# Patient Record
Sex: Female | Born: 1990 | Race: Black or African American | Hispanic: No | Marital: Single | Smoking: Never smoker
Health system: Southern US, Community
[De-identification: ages and names within clinical notes are randomized; demographics above are authoritative.]

## PROBLEM LIST (undated history)

## (undated) DIAGNOSIS — R51 Headache: Secondary | ICD-10-CM

---
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
## 2008-06-14 ENCOUNTER — Emergency Department (HOSPITAL_COMMUNITY): Admission: EM | Admit: 2008-06-14 | Discharge: 2008-06-14 | Payer: Self-pay | Admitting: Emergency Medicine

## 2008-07-14 ENCOUNTER — Emergency Department (HOSPITAL_COMMUNITY): Admission: EM | Admit: 2008-07-14 | Discharge: 2008-07-14 | Payer: Self-pay | Admitting: Family Medicine

## 2010-06-01 ENCOUNTER — Emergency Department (HOSPITAL_COMMUNITY): Admission: EM | Admit: 2010-06-01 | Discharge: 2010-06-01 | Payer: Self-pay | Admitting: Family Medicine

## 2010-10-03 ENCOUNTER — Emergency Department (HOSPITAL_COMMUNITY)
Admission: EM | Admit: 2010-10-03 | Discharge: 2010-10-03 | Disposition: A | Discharge status: Home / Self Care | Payer: BC Managed Care – PPO | Attending: Emergency Medicine | Admitting: Emergency Medicine

## 2010-10-03 ENCOUNTER — Emergency Department (HOSPITAL_COMMUNITY): Payer: BC Managed Care – PPO

## 2010-10-03 DIAGNOSIS — M549 Dorsalgia, unspecified: Secondary | ICD-10-CM | POA: Insufficient documentation

## 2010-10-03 DIAGNOSIS — M25519 Pain in unspecified shoulder: Secondary | ICD-10-CM | POA: Insufficient documentation

## 2010-10-03 DIAGNOSIS — M940 Chondrocostal junction syndrome [Tietze]: Secondary | ICD-10-CM | POA: Insufficient documentation

## 2010-10-03 DIAGNOSIS — R071 Chest pain on breathing: Secondary | ICD-10-CM | POA: Insufficient documentation

## 2010-10-03 DIAGNOSIS — R079 Chest pain, unspecified: Secondary | ICD-10-CM | POA: Insufficient documentation

## 2010-10-03 LAB — POCT CARDIAC MARKERS
CKMB, poc: <1 ng/mL — ABNORMAL LOW (ref 1.0–8.0)
CKMB, poc: <1 ng/mL — ABNORMAL LOW (ref 1.0–8.0)
Myoglobin, poc: 62.8 ng/mL (ref 12–200)
Myoglobin, poc: 59 ng/mL (ref 12–200)
Troponin i, poc: <0.05 ng/mL (ref 0.00–0.09)
Troponin i, poc: <0.05 ng/mL (ref 0.00–0.09)

## 2010-10-03 LAB — POCT I-STAT, CHEM 8
BUN: 14 mg/dL (ref 6–23)
Calcium, Ion: 1.14 mmol/L (ref 1.12–1.32)
Chloride: 106 meq/L (ref 96–112)
Creatinine, Ser: 1.2 mg/dL (ref 0.4–1.2)
Glucose, Bld: 88 mg/dL (ref 70–99)
HCT: 49 % — ABNORMAL HIGH (ref 36.0–46.0)
Hemoglobin: 16.7 g/dL — ABNORMAL HIGH (ref 12.0–15.0)
Potassium: 3.4 meq/L — ABNORMAL LOW (ref 3.5–5.1)
Sodium: 141 meq/L (ref 135–145)
TCO2: 23 mmol/L (ref 0–100)

## 2010-10-03 LAB — COMPREHENSIVE METABOLIC PANEL WITH GFR
ALT: 11 U/L (ref 0–35)
AST: 18 U/L (ref 0–37)
Albumin: 3.9 g/dL (ref 3.5–5.2)
Alkaline Phosphatase: 68 U/L (ref 39–117)
BUN: 13 mg/dL (ref 6–23)
CO2: 23 meq/L (ref 19–32)
Calcium: 9.8 mg/dL (ref 8.4–10.5)
Chloride: 106 meq/L (ref 96–112)
Creatinine, Ser: 1.07 mg/dL (ref 0.4–1.2)
GFR calc non Af Amer: >60 mL/min
Glucose, Bld: 93 mg/dL (ref 70–99)
Potassium: 3.4 meq/L — ABNORMAL LOW (ref 3.5–5.1)
Sodium: 137 meq/L (ref 135–145)
Total Bilirubin: 0.3 mg/dL (ref 0.3–1.2)
Total Protein: 8.7 g/dL — ABNORMAL HIGH (ref 6.0–8.3)

## 2010-10-03 LAB — DIFFERENTIAL
Basophils Relative: 0 % (ref 0–1)
Eosinophils Absolute: 0.1 10*3/uL (ref 0.0–0.7)
Lymphs Abs: 2.6 10*3/uL (ref 0.7–4.0)
Monocytes Absolute: 0.7 10*3/uL (ref 0.1–1.0)
Monocytes Relative: 7 % (ref 3–12)

## 2010-10-03 LAB — CBC
MCH: 29.6 pg (ref 26.0–34.0)
MCHC: 34.5 g/dL (ref 30.0–36.0)
MCV: 85.7 fL (ref 78.0–100.0)
Platelets: 224 10*3/uL (ref 150–400)

## 2010-10-03 LAB — POCT PREGNANCY, URINE: Preg Test, Ur: NEGATIVE

## 2010-10-03 LAB — LIPASE, BLOOD: Lipase: 29 U/L (ref 11–59)

## 2010-10-03 LAB — D-DIMER, QUANTITATIVE

## 2010-10-26 LAB — POCT URINALYSIS DIPSTICK
Glucose, UA: NEGATIVE mg/dL
Hgb urine dipstick: NEGATIVE
Nitrite: NEGATIVE
Protein, ur: 100 mg/dL — AB
Specific Gravity, Urine: 1.02 (ref 1.005–1.030)
Urobilinogen, UA: 1 mg/dL (ref 0.0–1.0)
pH: 6 (ref 5.0–8.0)

## 2011-05-09 LAB — URINALYSIS, ROUTINE W REFLEX MICROSCOPIC
Bilirubin Urine: NEGATIVE
Hgb urine dipstick: NEGATIVE
Nitrite: NEGATIVE
Specific Gravity, Urine: 1.022
Urobilinogen, UA: 1
pH: 5.5

## 2011-05-09 LAB — URINE MICROSCOPIC-ADD ON

## 2011-05-09 LAB — CBC
HCT: 41.8
MCHC: 33.2
MCV: 84
Platelets: 301
RDW: 14.7
WBC: 7.4

## 2011-05-09 LAB — DIFFERENTIAL
Basophils Absolute: 0.1
Eosinophils Relative: 1
Lymphocytes Relative: 32
Lymphs Abs: 2.4
Monocytes Absolute: 0.6
Neutro Abs: 4.3

## 2011-05-09 LAB — COMPREHENSIVE METABOLIC PANEL
AST: 33
Albumin: 3.7
BUN: 9
Calcium: 9.6
Creatinine, Ser: 1.07
GFR calc Af Amer: >60
Total Bilirubin: 0.7
Total Protein: 8.1

## 2011-05-09 LAB — WET PREP, GENITAL: Trich, Wet Prep: NONE SEEN

## 2011-05-19 LAB — POCT INFECTIOUS MONO SCREEN: Mono Screen: NEGATIVE

## 2011-05-19 LAB — POCT RAPID STREP A: Streptococcus, Group A Screen (Direct): NEGATIVE

## 2011-10-09 ENCOUNTER — Encounter (HOSPITAL_COMMUNITY): Payer: Self-pay | Admitting: *Deleted

## 2011-10-09 ENCOUNTER — Emergency Department (HOSPITAL_COMMUNITY)
Admission: EM | Admit: 2011-10-09 | Discharge: 2011-10-10 | Discharge status: Against Medical Advice | Payer: BC Managed Care – PPO | Attending: Emergency Medicine | Admitting: Emergency Medicine

## 2011-10-09 DIAGNOSIS — R51 Headache: Secondary | ICD-10-CM

## 2011-10-09 HISTORY — DX: Headache: R51

## 2011-10-09 NOTE — ED Notes (Signed)
Headache since yesterday no nv.  Hx of the same

## 2011-10-10 LAB — URINALYSIS, ROUTINE W REFLEX MICROSCOPIC
Glucose, UA: NEGATIVE mg/dL
Ketones, ur: NEGATIVE mg/dL
Leukocytes, UA: NEGATIVE
Nitrite: NEGATIVE
Protein, ur: 30 mg/dL — AB
Urobilinogen, UA: 1 mg/dL (ref 0.0–1.0)

## 2011-10-10 LAB — POCT PREGNANCY, URINE: Preg Test, Ur: NEGATIVE

## 2011-10-10 LAB — URINE MICROSCOPIC-ADD ON

## 2011-10-10 MED ORDER — KETOROLAC TROMETHAMINE 30 MG/ML IJ SOLN
30.0000 mg | Freq: Once | INTRAMUSCULAR | Status: AC
  Administered 2011-10-10: 30 mg via INTRAMUSCULAR
  Filled 2011-10-10: qty 1

## 2011-10-10 MED ORDER — DIPHENHYDRAMINE HCL 25 MG PO CAPS: 25.0000 mg | ORAL_CAPSULE | Freq: Once | ORAL | Status: DC

## 2011-10-10 MED ORDER — DEXAMETHASONE 10 MG/ML FOR PEDIATRIC ORAL USE: 10.0000 mg | Freq: Once | INTRAMUSCULAR | Status: DC

## 2011-10-10 MED ORDER — TRAMADOL HCL 50 MG PO TABS
50.0000 mg | ORAL_TABLET | Freq: Once | ORAL | Status: AC
  Administered 2011-10-10: 50 mg via ORAL
  Filled 2011-10-10: qty 1

## 2011-10-10 NOTE — ED Provider Notes (Signed)
Medical screening examination/treatment/procedure(s) were performed by non-physician practitioner and as supervising physician I was immediately available for consultation/collaboration.   Logen Heintzelman A. Derk Doubek, MD 10/10/11 2256 

## 2011-10-10 NOTE — ED Provider Notes (Signed)
Medical screening examination/treatment/procedure(s) were performed by non-physician practitioner and as supervising physician I was immediately available for consultation/collaboration.   Laelynn Blizzard A. Patrica Duel, MD 10/10/11 1610

## 2011-10-10 NOTE — ED Provider Notes (Addendum)
History     CSN: [id number]  Arrival date & time 10/09/11  2125   First MD Initiated Contact with Patient 10/10/11 0025      Chief Complaint  Patient presents with  . Headache    (Consider location/radiation/quality/duration/timing/severity/associated sxs/prior treatment) HPI Comments: Patient with chronic headaches 2-3 per week, usually treated with Excedrin Migraine.  She's had this headache for 2 days persistently without relief from multiple doses of Excedrin migraine.  Denies nausea, vomiting, photophobia, phonophobia, sinus congestion, URI, symptoms  Patient is a 21 y.o. female presenting with headaches. The history is provided by the patient.  Headache  This is a chronic problem. The current episode started yesterday. The problem occurs constantly. The problem has not changed since onset.The headache is associated with nothing. The pain is located in the left unilateral region. The quality of the pain is described as dull and throbbing. The pain is at a severity of 4/10. The pain is moderate. The pain does not radiate. Pertinent negatives include no fever and no vomiting.    Past Medical History  Diagnosis Date  . Headache     History reviewed. No pertinent past surgical history.  History reviewed. No pertinent family history.  History  Substance Use Topics  . Smoking status: Never Smoker   . Smokeless tobacco: Not on file  . Alcohol Use: No    OB History    Grav Para Term Preterm Abortions TAB SAB Ect Mult Living                  Review of Systems  Constitutional: Negative for fever and chills.  HENT: Negative for rhinorrhea.   Gastrointestinal: Negative for vomiting.  Neurological: Positive for headaches. Negative for dizziness and weakness.    Allergies  Review of patient's allergies indicates no known allergies.  Home Medications   Current Outpatient Rx  Name Route Sig Dispense Refill  . ASPIRIN-ACETAMINOPHEN-CAFFEINE 250-250-65 MG PO TABS Oral  Take 2 tablets by mouth every 6 (six) hours as needed. For head ache    . NORGESTREL-ETHINYL ESTRADIOL 0.3-30 MG-MCG PO TABS Oral Take 1 tablet by mouth daily.      BP 104/62  Pulse 76  Temp(Src) 98.6 F (37 C) (Oral)  Resp 16  SpO2 100%  LMP 10/09/2011  Physical Exam  Constitutional: She appears well-developed.  Eyes: Pupils are equal, round, and reactive to light.  Neck: Normal range of motion.  Pulmonary/Chest: Effort normal.  Musculoskeletal: Normal range of motion.  Neurological: She is alert.  Skin: Skin is warm and dry.    ED Course  Procedures (including critical care time)  Labs Reviewed  URINALYSIS, ROUTINE W REFLEX MICROSCOPIC - Abnormal; Notable for the following:    Specific Gravity, Urine 1.036 (*)    Protein, ur 30 (*)    All other components within normal limits  POCT PREGNANCY, URINE  URINE MICROSCOPIC-ADD ON  LAB REPORT - SCANNED   No results found.   1. Headache       MDM  Chronic headaches.  Will refer patient to the [facility]        Arman Filter, NP 10/10/11 0148  Arman Filter, NP 10/10/11 2003  Arman Filter, NP 10/10/11 2005

## 2011-10-10 NOTE — ED Notes (Signed)
Pt presents to department for evaluation of headache. Pt states history of migraines, no relief with medications at home. Pt also states dizziness and photosensitivity. No nausea/vomiting. 8/10 pain at the time. Ambulatory to exam room. She is conscious alert and oriented x4. Pupils reactive to light. No neurological deficits noted. No signs of acute distress at present.

## 2011-10-10 NOTE — ED Notes (Signed)
Pt states headache continues, no relief from pain. Rating 7/10 at the time. Will continue to monitor. Provider notified.

## 2011-10-10 NOTE — ED Notes (Addendum)
Pt wishing to leave AMA, informed of risks. Provider notified. Pt instructed to return with worsening of symptoms.

## 2011-11-14 ENCOUNTER — Encounter (HOSPITAL_COMMUNITY): Payer: Self-pay | Admitting: Emergency Medicine

## 2011-11-14 ENCOUNTER — Emergency Department (INDEPENDENT_AMBULATORY_CARE_PROVIDER_SITE_OTHER)
Admission: EM | Admit: 2011-11-14 | Discharge: 2011-11-14 | Disposition: A | Discharge status: Home / Self Care | Payer: BC Managed Care – PPO | Source: Home / Self Care | Attending: Family Medicine | Admitting: Family Medicine

## 2011-11-14 DIAGNOSIS — B373 Candidiasis of vulva and vagina: Secondary | ICD-10-CM

## 2011-11-14 LAB — POCT URINALYSIS DIP (DEVICE)
Glucose, UA: NEGATIVE mg/dL
Hgb urine dipstick: NEGATIVE
Ketones, ur: 15 mg/dL — AB
Nitrite: NEGATIVE
Specific Gravity, Urine: 1.02 (ref 1.005–1.030)
pH: 7.5 (ref 5.0–8.0)

## 2011-11-14 LAB — WET PREP, GENITAL
Trich, Wet Prep: NONE SEEN
Yeast Wet Prep HPF POC: NONE SEEN

## 2011-11-14 MED ORDER — FLUCONAZOLE 150 MG PO TABS: 150.0000 mg | ORAL_TABLET | Freq: Once | ORAL | Status: AC

## 2011-11-14 NOTE — ED Provider Notes (Signed)
History     CSN: [id number]  Arrival date & time 11/14/11  1123   First MD Initiated Contact with Patient 11/14/11 1236      Chief Complaint  Patient presents with  . Vaginal Discharge    (Consider location/radiation/quality/duration/timing/severity/associated sxs/prior treatment) HPI Comments: Poet presents for evaluation of vaginal discharge since yesterday. She reports some "burning" and irritation, but no itching, no urinary symptoms.   Patient is a 21 y.o. female presenting with vaginal discharge. The history is provided by the patient.  Vaginal Discharge This is a new problem. The problem occurs constantly. The problem has not changed since onset.The symptoms are aggravated by nothing. The symptoms are relieved by nothing. Treatments tried: Monistat.    Past Medical History  Diagnosis Date  . Headache     History reviewed. No pertinent past surgical history.  No family history on file.  History  Substance Use Topics  . Smoking status: Never Smoker   . Smokeless tobacco: Not on file  . Alcohol Use: No    OB History    Grav Para Term Preterm Abortions TAB SAB Ect Mult Living                  Review of Systems  Constitutional: Negative.   HENT: Negative.   Eyes: Negative.   Respiratory: Negative.   Cardiovascular: Negative.   Gastrointestinal: Negative.   Genitourinary: Positive for vaginal discharge. Negative for dysuria, urgency, frequency, hematuria, decreased urine volume, vaginal bleeding, vaginal pain, menstrual problem and pelvic pain.  Musculoskeletal: Negative.   Skin: Negative.   Neurological: Negative.     Allergies  Review of patient's allergies indicates no known allergies.  Home Medications   Current Outpatient Rx  Name Route Sig Dispense Refill  . MONISTAT 1 VA Vaginal Place vaginally. Monistat one day treatment used yesterday    . ASPIRIN-ACETAMINOPHEN-CAFFEINE 250-250-65 MG PO TABS Oral Take 2 tablets by mouth every 6 (six) hours  as needed. For head ache    . FLUCONAZOLE 150 MG PO TABS Oral Take 1 tablet (150 mg total) by mouth once. Take one pill once. May repeat if symptoms persist after 3rd day. 2 tablet 0  . NORGESTREL-ETHINYL ESTRADIOL 0.3-30 MG-MCG PO TABS Oral Take 1 tablet by mouth daily.      BP 133/89  Pulse 90  Temp(Src) 98.4 F (36.9 C) (Oral)  Resp 16  SpO2 100%  LMP 11/04/2011  Physical Exam  Nursing note and vitals reviewed. Constitutional: She is oriented to person, place, and time. She appears well-developed and well-nourished.  HENT:  Head: Normocephalic and atraumatic.  Eyes: EOM are normal.  Neck: Normal range of motion.  Pulmonary/Chest: Effort normal.  Genitourinary: Pelvic exam was performed with patient supine. There is no rash on the right labia. There is no rash on the left labia. Cervix exhibits no discharge. Vaginal discharge found.  Musculoskeletal: Normal range of motion.  Neurological: She is alert and oriented to person, place, and time.  Skin: Skin is warm and dry.  Psychiatric: Her behavior is normal.    ED Course  Procedures (including critical care time)  Labs Reviewed  POCT URINALYSIS DIP (DEVICE) - Abnormal; Notable for the following:    Bilirubin Urine SMALL (*)    Ketones, ur 15 (*)    Protein, ur 30 (*)    Leukocytes, UA TRACE (*) Biochemical Testing Only. Please order routine urinalysis from main lab if confirmatory testing is needed.   All other components within normal limits  POCT PREGNANCY, URINE   No results found.   1. Candidal vaginitis       MDM  Labs reviewed; rx given for fluconazole PO        Renaee Munda, MD 11/14/11 1513

## 2011-11-14 NOTE — Discharge Instructions (Signed)
Take medication as directed. Return to care should your symptoms not improve, or worsen in any way.

## 2011-11-14 NOTE — ED Notes (Signed)
Equipment at bedside, patient undressed

## 2011-11-14 NOTE — ED Notes (Signed)
On Friday, noticed vaginal burning.  Patient was using tampons on Friday due to menses, then period ended (stopped using tampons ) and burning continued.  Yesterday noticed vaginal discharge.  Denies urinary symptoms.

## 2011-11-16 ENCOUNTER — Telehealth (HOSPITAL_COMMUNITY): Payer: Self-pay | Admitting: *Deleted

## 2011-11-16 LAB — GC/CHLAMYDIA PROBE AMP, GENITAL
Chlamydia, DNA Probe: NEGATIVE
GC Probe Amp, Genital: NEGATIVE

## 2011-11-16 MED ORDER — METRONIDAZOLE 500 MG PO TABS: 500.0000 mg | ORAL_TABLET | Freq: Two times a day (BID) | ORAL | Status: AC

## 2011-11-16 NOTE — ED Notes (Signed)
GC/Chlamydia neg., Wet prep: mod. clue cells, few WBC's.  Pt. treated with Diflucan.  Labs shown to Dr. Lorenza Chick and he e-prescribed Flagyl to the [facility] on [address].  I called pt. Pt. verified x 2 and given results. Pt. told that the doctor sent her Rx. to her preferred pharmacy the [facility] on [address] and to call before picking it up to make sure it was ready.  Pt. instructed to no alcohol while taking this medication. Pt.'s questions answered.  Alicia Chan 11/16/2011

## 2012-05-10 IMAGING — CR DG CHEST 2V
2 series · 2 of 2 positions shown · non-contrast
Comparison: None.

CLINICAL DATA: Chest pain.

CHEST - 2 VIEW

[w chest pa]
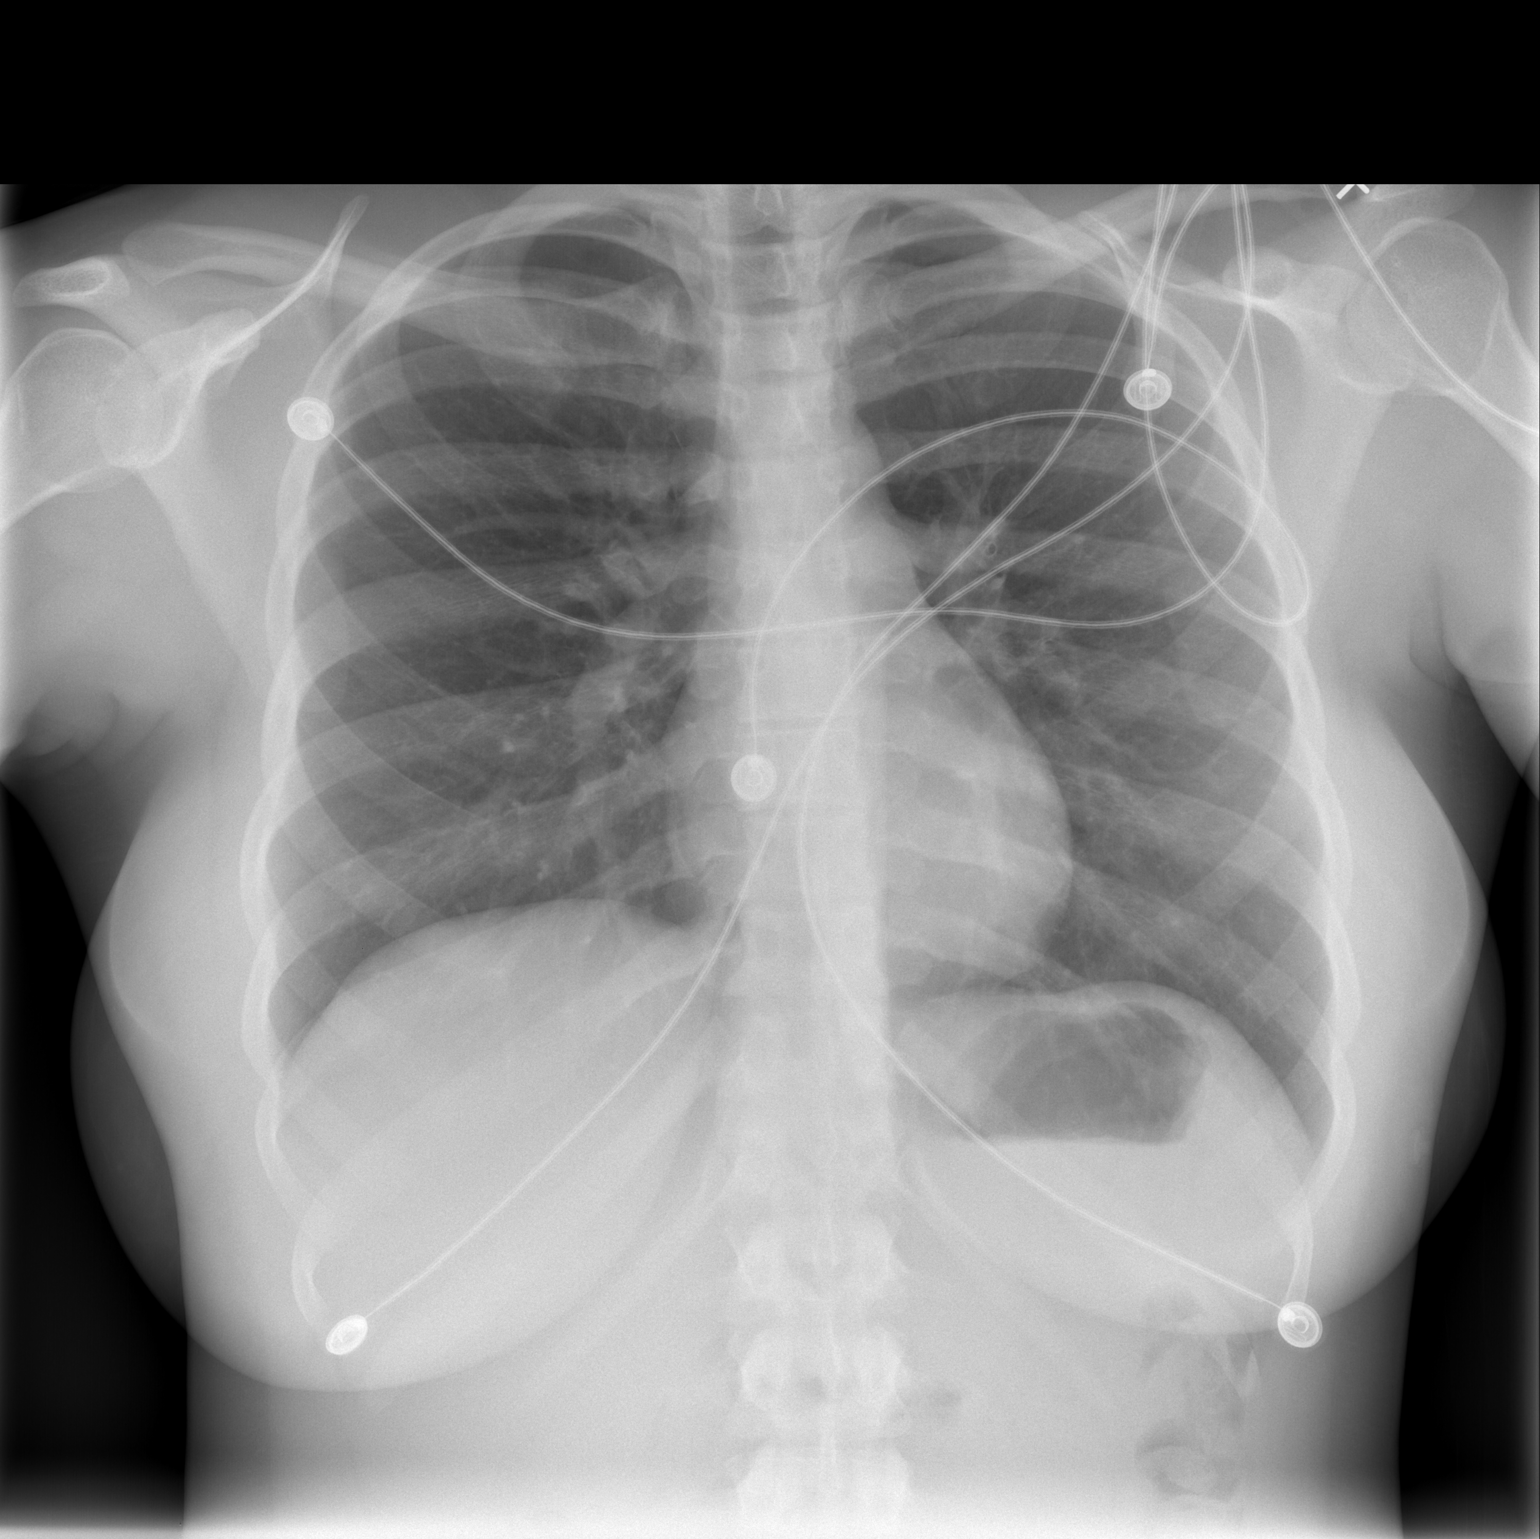

[w chest lat]
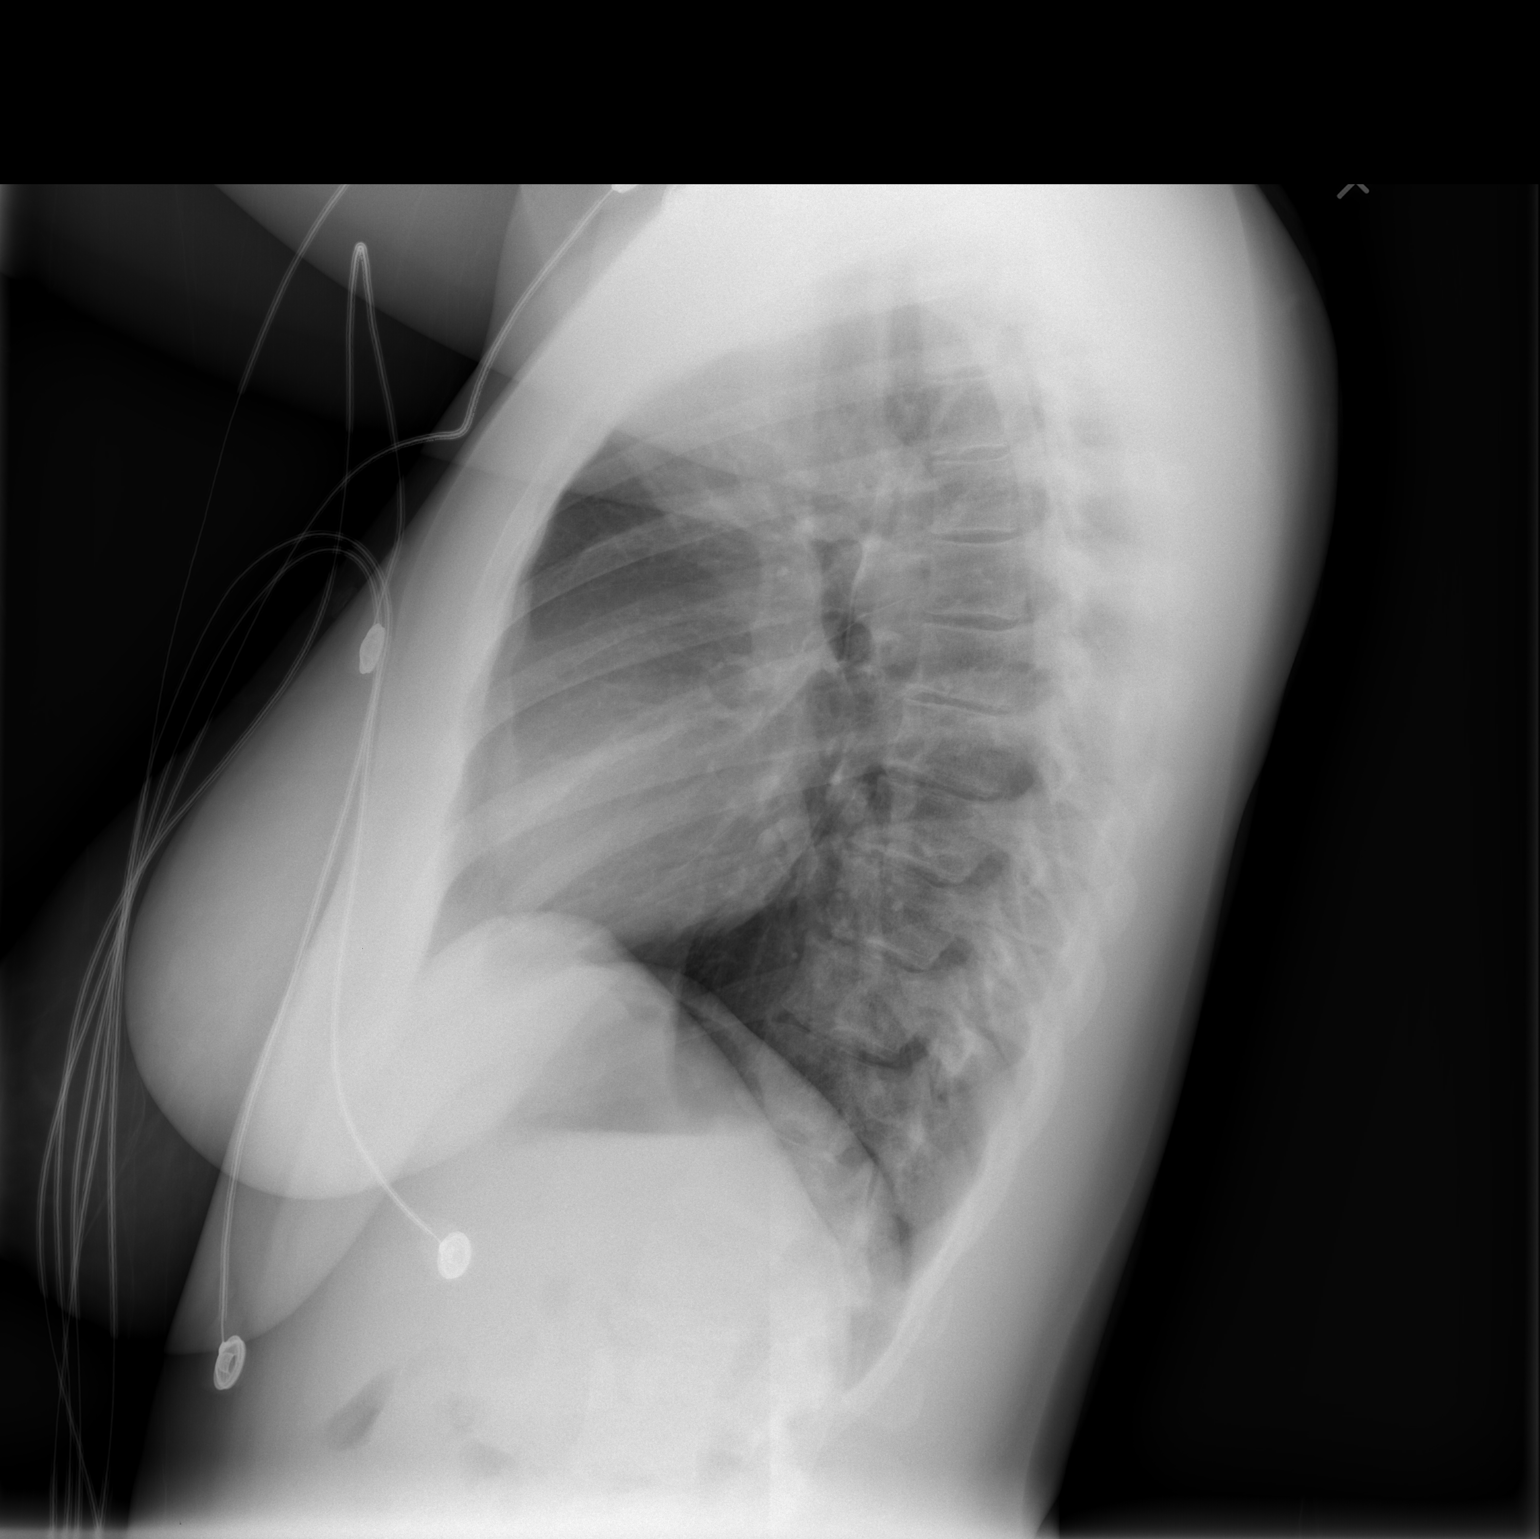

[2 of 2 positions shown; findings below may reference images not displayed]

FINDINGS: The heart size is normal.  The lungs are clear.  The
visualized soft tissues and bony thorax are unremarkable.
IMPRESSION: Negative chest.

## 2016-02-02 ENCOUNTER — Telehealth: Payer: Self-pay | Admitting: Physician Assistant

## 2016-02-02 ENCOUNTER — Emergency Department
Admission: EM | Admit: 2016-02-02 | Discharge: 2016-02-02 | Disposition: A | Discharge status: Home / Self Care | Payer: Worker's Compensation | Attending: Emergency Medicine | Admitting: Emergency Medicine

## 2016-02-02 DIAGNOSIS — Z7982 Long term (current) use of aspirin: Secondary | ICD-10-CM | POA: Diagnosis not present

## 2016-02-02 DIAGNOSIS — Z77098 Contact with and (suspected) exposure to other hazardous, chiefly nonmedicinal, chemicals: Secondary | ICD-10-CM | POA: Diagnosis not present

## 2016-02-02 LAB — ALT: ALT: 17 U/L (ref 14–54)

## 2016-02-02 LAB — CBC WITH DIFFERENTIAL/PLATELET
Basophils Absolute: 0 10*3/uL (ref 0–0.1)
Basophils Relative: 1 %
EOS ABS: 0.1 10*3/uL (ref 0–0.7)
EOS PCT: 1 %
HCT: 40.4 % (ref 35.0–47.0)
Hemoglobin: 13.2 g/dL (ref 12.0–16.0)
LYMPHS ABS: 2.8 10*3/uL (ref 1.0–3.6)
LYMPHS PCT: 37 %
MCH: 28.7 pg (ref 26.0–34.0)
MCHC: 32.7 g/dL (ref 32.0–36.0)
MCV: 87.8 fL (ref 80.0–100.0)
MONO ABS: 0.5 10*3/uL (ref 0.2–0.9)
MONOS PCT: 7 %
Neutro Abs: 4.1 10*3/uL (ref 1.4–6.5)
Neutrophils Relative %: 54 %
PLATELETS: 245 10*3/uL (ref 150–440)
RBC: 4.6 MIL/uL (ref 3.80–5.20)
RDW: 13.5 % (ref 11.5–14.5)
WBC: 7.6 10*3/uL (ref 3.6–11.0)

## 2016-02-02 LAB — RAPID HIV SCREEN (HIV 1/2 AB+AG)
HIV 1/2 Antibodies: NONREACTIVE
HIV-1 P24 ANTIGEN - HIV24: REACTIVE — AB

## 2016-02-02 LAB — AST: AST: 19 U/L (ref 15–41)

## 2016-02-02 LAB — CREATININE, SERUM
CREATININE: 0.77 mg/dL (ref 0.44–1.00)
GFR calc Af Amer: >60 mL/min (ref >60)
GFR calc non Af Amer: >60 mL/min (ref >60)

## 2016-02-02 NOTE — ED Provider Notes (Signed)
[facility] Emergency Department Provider Note   ____________________________________________  Time seen: Approximately 8:16 PM  I have reviewed the triage vital signs and the nursing notes.   HISTORY  Chief Complaint Chemical Exposure    HPI Alicia Chan is a 25 y.o. 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 Patient was splashed in the face by a specimen tray containing a buccal swab and chemical  that amplifies DNA APPROXIMATELY 3 HOURS AGO. Patient stays copious eye irrigation was performed prior to arrival. Patient did not believe that the chemotherapy gotten into her eye or mouth. Patient is requesting chemical exposure protocol. There is no complaint of pain.  Past Medical History  Diagnosis Date  . Headache(784.0)     There are no active problems to display for this patient.   History reviewed. No pertinent past surgical history.  Current Outpatient Rx  Name  Route  Sig  Dispense  Refill  . aspirin-acetaminophen-caffeine (EXCEDRIN MIGRAINE) 250-250-65 MG per tablet   Oral   Take 2 tablets by mouth every 6 (six) hours as needed. For head ache         . norgestrel-ethinyl estradiol (LO/OVRAL,CRYSELLE) 0.3-30 MG-MCG tablet   Oral   Take 1 tablet by mouth daily.         . Tioconazole (MONISTAT 1 VA)   Vaginal   Place vaginally. Monistat one day treatment used yesterday           Allergies Review of patient's allergies indicates no known allergies.  No family history on file.  Social History Social History  Substance Use Topics  . Smoking status: Never Smoker   . Smokeless tobacco: None  . Alcohol Use: No    Review of Systems Constitutional: No fever/chills Eyes: No visual changes. ENT: No sore throat. Cardiovascular: Denies chest pain. Respiratory: Denies shortness of breath. Gastrointestinal: No abdominal pain.  No nausea, no vomiting.  No diarrhea.  No constipation. Genitourinary:  Negative for dysuria. Musculoskeletal: Negative for back pain. Skin: Negative for rash. Neurological: Negative for headaches, focal weakness or numbness. ____________________________________________   PHYSICAL EXAM:  VITAL SIGNS: ED Triage Vitals  Enc Vitals Group     BP 02/02/16 1919 118/85 mmHg     Pulse Rate 02/02/16 1919 79     Resp 02/02/16 1919 18     Temp 02/02/16 1919 97.8 F (36.6 C)     Temp Source 02/02/16 1919 Oral     SpO2 02/02/16 1919 100 %     Weight 02/02/16 1919 196 lb (88.905 kg)     Height 02/02/16 1919  (1.702 m)     Head Cir --      Peak Flow --      Pain Score 02/02/16 1920 3     Pain Loc --      Pain Edu? --      Excl. in GC? --     Constitutional: Alert and oriented. Well appearing and in no acute distress. Eyes: Conjunctivae are normal. PERRL. EOMI. Head: Atraumatic. Nose: No congestion/rhinnorhea. Mouth/Throat: Mucous membranes are moist.  Oropharynx non-erythematous. Neck: No stridor.  No cervical spine tenderness to palpation. Hematological/Lymphatic/Immunilogical: No cervical lymphadenopathy. Cardiovascular: Normal rate, regular rhythm. Grossly normal heart sounds.  Good peripheral circulation. Respiratory: Normal respiratory effort.  No retractions. Lungs CTAB. Gastrointestinal: Soft and nontender. No distention. No abdominal bruits. No CVA tenderness. Musculoskeletal: No lower extremity tenderness nor edema.  No joint effusions. Neurologic:  Normal speech and language. No  gross focal neurologic deficits are appreciated. No gait instability. Skin:  Skin is warm, dry and intact. No rash noted. Psychiatric: Mood and affect are normal. Speech and behavior are normal.  ____________________________________________   LABS (all labs ordered are listed, but only abnormal results are displayed)  Labs Reviewed  HEPATITIS PANEL, ACUTE  RAPID HIV SCREEN (HIV 1/2 AB+AG)    ____________________________________________  EKG   ____________________________________________  RADIOLOGY   ____________________________________________   PROCEDURES  Procedure(s) performed: None  Critical Care performed: No  ____________________________________________   INITIAL IMPRESSION / ASSESSMENT AND PLAN / ED COURSE  Pertinent labs & imaging results that were available during my care of the patient were reviewed by me and considered in my medical decision making (see chart for details).  Chemical exposure. Patient given discharge care instructions. Patient had baseline acute hep panel and HIV drawn today. Per protocol patient had repeat labs in 6 weeks and 6 months. Patient reports understanding of pending labs.  ____________________________________________   FINAL CLINICAL IMPRESSION(S) / ED DIAGNOSES  Final diagnoses:  Exposure to chemical compounds      NEW MEDICATIONS STARTED DURING THIS VISIT:  New Prescriptions   No medications on file     Note:  This document was prepared using Dragon voice recognition software and may include unintentional dictation errors.    Joni Reiningonald K Zykia Walla, PA-C 02/02/16 2032  Phineas SemenGraydon Goodman, MD 02/02/16 [phone]

## 2016-02-02 NOTE — ED Notes (Signed)
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 Information passed along to original provider Katrinka Blazing(Smith, PA-C) who asked that this RN call patient and advise her to follow up with ID. Ula LingoSmith, PA-C provided [phone] for this RN to give to patient and have her call them.

## 2016-02-02 NOTE — ED Notes (Addendum)
Working at [employer] and a specimen in a tray splashed her in the face. Buccal Swab and chemical that amplifies the DNA splashed on her face. She felt something on top lip and left cheek.  They did eyewash at work and sent patient here. States her eyes are burning a little. No redness noted to either eye and no drainage noted.

## 2016-02-02 NOTE — Discharge Instructions (Signed)
Pertinent exposure protocol advised repeat acute hepatitis panel and HIV in 6 weeks.

## 2016-02-02 NOTE — ED Notes (Signed)
23:15 - Patient returned call at this time. RN explained to her that the results of her recent HIV was POSITIVE and that she needed to follow up with ID ASAP for further care and treatment. Patient provided with the number that the PA gave [phone]) and told to call them first thing in the morning.

## 2016-02-02 NOTE — ED Notes (Signed)
Working at [employer] and a specimen in a tray splashed her in the face. They did eyewash at work and sent patient here. States her eyes are burning a little. No redness noted to either eye and no drainage noted.

## 2016-02-02 NOTE — ED Notes (Signed)
Worker's comp. completed and delivered to lab for courier p/u.

## 2016-02-03 NOTE — Progress Notes (Signed)
ID note  Spoke with patient - she reports negative test in May 19th during STD screen. She also had routine gyn visit same day and testing is pending.   I have asked her to come to [facility] to [facility] me today for further evaluation and testing.

## 2016-02-03 NOTE — ED Notes (Signed)
At 1225 today [name] from lab called to speak to charge RN and told that pt had a "Positive Hit" on HIV testing and 2 additional SST tubes were to be drawn. There were a total per lab of a lavender and 1 SST tube drawn. Pt was called at 1300 to notify that lab needed pt to come back to have additional labs drawn. Per lab pt was to come directly to lab. Pt informed of this and reports that she is on her way.

## 2016-02-04 ENCOUNTER — Telehealth: Payer: Self-pay | Admitting: Infectious Disease

## 2016-02-04 LAB — HEPATITIS PANEL, ACUTE
HCV Ab: <0.1 s/co ratio (ref 0.0–0.9)
Hep A IgM: NEGATIVE
Hep B C IgM: NEGATIVE
Hepatitis B Surface Ag: NEGATIVE

## 2016-02-04 NOTE — Telephone Encounter (Signed)
Alicia Chan did she never show for appt with you?  We could see her in research as a possible acute infection which seems likely wit p24 ag on rapid test but she would need to come back next week when we are back from [place]

## 2016-02-07 LAB — HIV-1/2 AB - DIFFERENTIATION
HIV 1 AB: NEGATIVE
HIV 2 AB: NEGATIVE

## 2016-02-07 LAB — RNA QUALITATIVE: HIV 1 RNA Qualitative: 1

## 2016-02-11 NOTE — Telephone Encounter (Signed)
My understanding was that her rapid test was false +

## 2016-03-20 ENCOUNTER — Telehealth: Payer: Self-pay | Admitting: Infectious Diseases

## 2016-03-21 NOTE — Telephone Encounter (Signed)
error
